# Patient Record
Sex: Male | Born: 1980
Health system: Midwestern US, Community
[De-identification: ages and names within clinical notes are randomized; demographics above are authoritative.]

---

## 2015-07-03 ENCOUNTER — Emergency Department (HOSPITAL_COMMUNITY): Payer: No Typology Code available for payment source

## 2015-07-03 ENCOUNTER — Encounter (HOSPITAL_COMMUNITY): Payer: Self-pay | Admitting: Emergency Medicine

## 2015-07-03 ENCOUNTER — Emergency Department (HOSPITAL_COMMUNITY)
Admission: EM | Admit: 2015-07-03 | Discharge: 2015-07-03 | Disposition: A | Discharge status: Home / Self Care | Payer: No Typology Code available for payment source | Attending: Emergency Medicine | Admitting: Emergency Medicine

## 2015-07-03 DIAGNOSIS — S80811A Abrasion, right lower leg, initial encounter: Secondary | ICD-10-CM | POA: Diagnosis not present

## 2015-07-03 DIAGNOSIS — S8991XA Unspecified injury of right lower leg, initial encounter: Secondary | ICD-10-CM | POA: Diagnosis present

## 2015-07-03 DIAGNOSIS — Y92410 Unspecified street and highway as the place of occurrence of the external cause: Secondary | ICD-10-CM | POA: Diagnosis not present

## 2015-07-03 DIAGNOSIS — Y9389 Activity, other specified: Secondary | ICD-10-CM | POA: Diagnosis not present

## 2015-07-03 DIAGNOSIS — Y998 Other external cause status: Secondary | ICD-10-CM | POA: Insufficient documentation

## 2015-07-03 DIAGNOSIS — F17200 Nicotine dependence, unspecified, uncomplicated: Secondary | ICD-10-CM | POA: Insufficient documentation

## 2015-07-03 DIAGNOSIS — M25562 Pain in left knee: Secondary | ICD-10-CM

## 2015-07-03 DIAGNOSIS — S8992XA Unspecified injury of left lower leg, initial encounter: Secondary | ICD-10-CM | POA: Diagnosis not present

## 2015-07-03 MED ORDER — NAPROXEN 500 MG PO TABS: 500.0000 mg | ORAL_TABLET | Freq: Two times a day (BID) | ORAL | Status: AC

## 2015-07-03 MED ORDER — METHOCARBAMOL 500 MG PO TABS
1000.0000 mg | ORAL_TABLET | Freq: Once | ORAL | Status: AC
Start: 2015-07-03 — End: 2015-07-03
  Administered 2015-07-03: 1000 mg via ORAL
  Filled 2015-07-03: qty 2

## 2015-07-03 MED ORDER — KETOROLAC TROMETHAMINE 60 MG/2ML IM SOLN
60.0000 mg | Freq: Once | INTRAMUSCULAR | Status: AC
  Administered 2015-07-03: 60 mg via INTRAMUSCULAR
  Filled 2015-07-03: qty 2

## 2015-07-03 MED ORDER — OXYCODONE-ACETAMINOPHEN 5-325 MG PO TABS
1.0000 | ORAL_TABLET | Freq: Once | ORAL | Status: AC
  Administered 2015-07-03: 1 via ORAL
  Filled 2015-07-03: qty 1

## 2015-07-03 MED ORDER — OXYCODONE-ACETAMINOPHEN 5-325 MG PO TABS: 1.0000 | ORAL_TABLET | ORAL | Status: AC | PRN

## 2015-07-03 NOTE — ED Notes (Signed)
Bed: WA29 Expected date:  Expected time:  Means of arrival:  Comments: 
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
## 2016-06-14 ENCOUNTER — Emergency Department: Admit: 2016-06-14

## 2016-06-14 ENCOUNTER — Inpatient Hospital Stay: Admit: 2016-06-14 | Discharge: 2016-06-14 | Disposition: A | Discharge status: Home / Self Care

## 2016-06-14 DIAGNOSIS — R1013 Epigastric pain: Secondary | ICD-10-CM

## 2016-06-14 LAB — CBC WITH AUTO DIFFERENTIAL
Basophils %: 0.4 %
Basophils Absolute: 0 10*3/uL (ref 0.0–0.2)
Eosinophils %: 1.5 %
Eosinophils Absolute: 0.1 10*3/uL (ref 0.0–0.7)
Hematocrit: 46.9 % (ref 42.0–52.0)
Hemoglobin: 16 g/dL (ref 14.0–18.0)
Lymphocytes %: 34 %
Lymphocytes Absolute: 2.9 10*3/uL (ref 1.0–4.8)
MCH: 30.9 pg (ref 27.0–31.3)
MCHC: 34.1 % (ref 33.0–37.0)
MCV: 90.5 fL (ref 80.0–100.0)
Monocytes %: 6.1 %
Monocytes Absolute: 0.5 10*3/uL (ref 0.2–0.8)
Neutrophils %: 58 %
Neutrophils Absolute: 5 10*3/uL (ref 1.4–6.5)
Platelets: 228 10*3/uL (ref 130–400)
RBC: 5.18 M/uL (ref 4.70–6.10)
RDW: 13.4 % (ref 11.5–14.5)
WBC: 8.7 10*3/uL (ref 4.8–10.8)

## 2016-06-14 LAB — URINALYSIS WITH REFLEX TO CULTURE
Bilirubin Urine: NEGATIVE
Blood, Urine: NEGATIVE
Glucose, Ur: NEGATIVE mg/dL
Ketones, Urine: NEGATIVE mg/dL
Leukocyte Esterase, Urine: NEGATIVE
Nitrite, Urine: NEGATIVE
Protein, UA: NEGATIVE mg/dL
Specific Gravity, UA: 1.031 (ref 1.005–1.030)
Urobilinogen, Urine: 0.2 E.U./dL (ref <2.0)
pH, UA: 6 (ref 5.0–9.0)

## 2016-06-14 LAB — URINE DRUG SCREEN
Amphetamine Screen, Urine: NEGATIVE (ref <1000)
Barbiturate Screen, Ur: NEGATIVE (ref <200)
Benzodiazepine Screen, Urine: NEGATIVE (ref <200)
Cannabinoid Scrn, Ur: NEGATIVE (ref <50)
Cocaine Metabolite Screen, Urine: NEGATIVE (ref <300)
Opiate Scrn, Ur: NEGATIVE (ref <300)
PCP Screen, Urine: NEGATIVE (ref <25)

## 2016-06-14 LAB — COMPREHENSIVE METABOLIC PANEL
ALT: 29 U/L (ref 0–41)
AST: 23 U/L (ref 0–40)
Albumin: 4.7 g/dL (ref 3.9–4.9)
Alkaline Phosphatase: 65 U/L (ref 35–104)
Anion Gap: 13 mEq/L (ref 7–13)
BUN: 15 mg/dL (ref 6–20)
CO2: 24 mEq/L (ref 22–29)
Calcium: 9.1 mg/dL (ref 8.6–10.2)
Chloride: 101 mEq/L (ref 98–107)
Creatinine: 0.81 mg/dL (ref 0.70–1.20)
GFR African American: >60 (ref >60)
GFR Non-African American: >60 (ref >60)
Globulin: 3.2 g/dL (ref 2.3–3.5)
Glucose: 92 mg/dL (ref 74–109)
Potassium: 4.2 mEq/L (ref 3.5–5.1)
Sodium: 138 mEq/L (ref 132–144)
Total Bilirubin: 0.4 mg/dL (ref 0.0–1.2)
Total Protein: 7.9 g/dL (ref 6.4–8.1)

## 2016-06-14 LAB — AMYLASE: Amylase: 56 U/L (ref 28–100)

## 2016-06-14 LAB — LIPASE: Lipase: 23 U/L (ref 13–60)

## 2016-06-14 MED ORDER — HYDROCODONE-ACETAMINOPHEN 5-325 MG PO TABS
5-325 MG | ORAL_TABLET | Freq: Three times a day (TID) | ORAL | 0 refills | Status: AC | PRN
Start: 2016-06-14 — End: 2016-06-17

## 2016-06-14 MED ORDER — OMEPRAZOLE 20 MG PO CPDR
20 MG | ORAL_CAPSULE | Freq: Every day | ORAL | 0 refills | Status: AC
Start: 2016-06-14 — End: ?

## 2016-06-14 MED ORDER — IOPAMIDOL 76 % IV SOLN
76 % | Freq: Once | INTRAVENOUS | Status: AC | PRN
Start: 2016-06-14 — End: 2016-06-14
  Administered 2016-06-14: 22:00:00 100 mL via INTRAVENOUS

## 2016-06-14 MED ORDER — SODIUM CHLORIDE 0.9 % IV BOLUS
0.9 | Freq: Once | INTRAVENOUS | Status: AC
Start: 2016-06-14 — End: 2016-06-14
  Administered 2016-06-14: 21:00:00 1000 mL via INTRAVENOUS

## 2016-06-14 MED ORDER — NORMAL SALINE FLUSH 0.9 % IV SOLN
0.9 % | Freq: Once | INTRAVENOUS | Status: AC
Start: 2016-06-14 — End: 2016-06-14
  Administered 2016-06-14: 22:00:00 10 mL via INTRAVENOUS

## 2016-06-14 MED ORDER — ONDANSETRON HCL 4 MG/2ML IJ SOLN
4 MG/2ML | Freq: Once | INTRAMUSCULAR | Status: AC
Start: 2016-06-14 — End: 2016-06-14
  Administered 2016-06-14: 21:00:00 4 mg via INTRAVENOUS

## 2016-06-14 MED ORDER — GI COCKTAIL
Freq: Once | Status: AC
Start: 2016-06-14 — End: 2016-06-14
  Administered 2016-06-14: 22:00:00 30 mL via ORAL

## 2016-06-14 MED ORDER — SUCRALFATE 1 GM/10ML PO SUSP
1 GM/0ML | Freq: Four times a day (QID) | ORAL | 0 refills | Status: AC
Start: 2016-06-14 — End: ?

## 2016-06-14 MED ORDER — MORPHINE SULFATE 4 MG/ML IJ SOLN
4 MG/ML | Freq: Once | INTRAMUSCULAR | Status: AC
Start: 2016-06-14 — End: 2016-06-14
  Administered 2016-06-14: 21:00:00 4 mg via INTRAVENOUS

## 2016-06-14 MED FILL — GI COCKTAIL: Qty: 30

## 2016-06-14 MED FILL — MONOJECT FLUSH SYRINGE 0.9 % IV SOLN: 0.9 % | INTRAVENOUS | Qty: 10

## 2016-06-14 MED FILL — ONDANSETRON HCL 4 MG/2ML IJ SOLN: 4 MG/2ML | INTRAMUSCULAR | Qty: 2

## 2016-06-14 MED FILL — MORPHINE SULFATE 4 MG/ML IJ SOLN: 4 mg/mL | INTRAMUSCULAR | Qty: 1

## 2016-06-14 MED FILL — SODIUM CHLORIDE 0.9 % IV SOLN: 0.9 % | INTRAVENOUS | Qty: 1000

## 2016-06-14 NOTE — ED Provider Notes (Signed)
[facility] ED  eMERGENCY dEPARTMENT eNCOUnter      Pt Name: Gabriel Wiley  MRN: [id number]  Birthdate [birth year]  Date of evaluation: 06/14/2016  Provider: Lorelee New, PA-C      HISTORY OF PRESENT ILLNESS    Gabriel Wiley is a 36 y.o. male who presents to the Emergency Department with Epigastric stomach pain.  This radiates to his back.  This is been going on for 3 months.  It's constant.  Sharp and stabbing.  The patient states he does have a history of pancreatitis.  He denies alcohol or gallbladder issues.  There is a family history of pancreatitis.  Patient is also been reporting nausea vomiting and diarrhea.  He had 4 episodes of emesis today and one episode of diarrhea.  He denies hematemesis or blood in stool.  He has tried Zantac, Tylenol and Motrin.  These are not helping.  The patient reports fever of 100.2??F.  He also reports dysuria.  He denies hematuria.  He denies chest pain tenderness but or difficulty breathing.        REVIEW OF SYSTEMS       Review of Systems   Constitutional: Positive for fever. Negative for chills, diaphoresis and fatigue.   HENT: Negative for congestion.    Respiratory: Negative for cough and shortness of breath.    Cardiovascular: Negative for chest pain and palpitations.   Gastrointestinal: Positive for abdominal pain, diarrhea, nausea and vomiting.   Genitourinary: Positive for dysuria. Negative for flank pain.   Skin: Negative for rash.   Neurological: Negative for dizziness, light-headedness and headaches.         PAST MEDICAL HISTORY     Past Medical History:   Diagnosis Date   ??? Pancreatitis          SURGICAL HISTORY       Past Surgical History:   Procedure Laterality Date   ??? APPENDECTOMY           CURRENT MEDICATIONS       Previous Medications    No medications on file       ALLERGIES     Patient has no known allergies.    FAMILY HISTORY     No family history on file.       SOCIAL HISTORY       Social History     Social History   ??? Marital status: Single      Spouse name: N/A   ??? Number of children: N/A   ??? Years of education: N/A     Social History Main Topics   ??? Smoking status: Current Every Day Smoker     Packs/day: 0.50     Years: 10.00     Types: Cigarettes   ??? Smokeless tobacco: Never Used      Comment: 2 cigarretes a day   ??? Alcohol use No   ??? Drug use: No   ??? Sexual activity: Not on file     Other Topics Concern   ??? Not on file     Social History Narrative   ??? No narrative on file       SCREENINGS             PHYSICAL EXAM    (up to 7 for level 4, 8 or more for level 5)     ED Triage Vitals [06/14/16 1542]   BP Temp Temp Source Pulse Resp SpO2 Height Weight   (!) 142/81 98.8 ??F (37.1 ??C)  Oral 85 16 98 % 5\' 7"  (1.702 m) 210 lb (95.3 kg)       Physical Exam   Constitutional: He is oriented to person, place, and time. He appears well-developed and well-nourished. He is active. No distress.   HENT:   Head: Normocephalic and atraumatic.   Mouth/Throat: Mucous membranes are normal.   Neck: Normal range of motion. Neck supple.   Cardiovascular: Normal rate, regular rhythm, normal heart sounds, intact distal pulses and normal pulses.    Pulmonary/Chest: Effort normal and breath sounds normal.   Abdominal: Soft. Normal appearance and bowel sounds are normal. There is tenderness in the epigastric area. There is no rigidity, no rebound and no guarding.   Neurological: He is alert and oriented to person, place, and time. He has normal strength.   Skin: Skin is warm, dry and intact. No rash noted. He is not diaphoretic.   Nursing note and vitals reviewed.        All other labs were within normal range or not returned as of this dictation.    EMERGENCY DEPARTMENT COURSE and DIFFERENTIAL DIAGNOSIS/MDM:   Vitals:    Vitals:    06/14/16 1542 06/14/16 1721   BP: (!) 142/81 119/76   Pulse: 85 76   Resp: 16 18   Temp: 98.8 ??F (37.1 ??C)    TempSrc: Oral    SpO2: 98% 97%   Weight: 210 lb (95.3 kg)    Height: 5\' 7"  (1.702 m)        ED Course        Patient is nontoxic no acute  distress.  He is resting comfortably.  Laboratory data is grossly unremarkable.  CT scan shows no acute pathology.  Urinalysis is grossly normal.  Patient's pain improved after GI cocktail significantly.  We discharged home with prescription for analgesic, omeprazole and Carafate.  He is instructed [facility] 2 days.  He is advised return to the ED for new or worsening symptoms.  Patient verbalized understanding of this plan.  Patient stable and ready for discharge.      PROCEDURES:  Unless otherwise noted below, none     Procedures      FINAL IMPRESSION      1. Abdominal pain, epigastric          DISPOSITION/PLAN   DISPOSITION Decision To Discharge 06/14/2016 05:29:57 PM          Lorelee NewHope B Smith, PA-C (electronically signed)  Attending Emergency Physician       Lorelee NewHope B Smith, PA-C  06/14/16 1737

## 2016-06-14 NOTE — ED Notes (Signed)
POC creat 0.8 called to CT scan.     Meda KlinefelterCheryl L Delicia Berens, RN  06/14/16 1635

## 2016-06-14 NOTE — ED Triage Notes (Signed)
Pt c/o abdominal pain for the past three months.  Pain was getting progressively worse the past three days. Abdomen is tender to touch all over BS+. LBM 3/6. No edema noted. Respirations even and nonlabored. LS clear.

## 2016-06-15 LAB — POCT VENOUS
GFR African American: >60 (ref >60)
GFR Non-African American: >60 (ref >60)
POC Creatinine: 0.8 mg/dL — ABNORMAL LOW (ref 0.9–1.3)
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
## 2019-12-04 LAB — CBC WITH AUTO DIFFERENTIAL
Absolute Eos #: 0.04 10*3/uL (ref 0.00–0.44)
Absolute Immature Granulocyte: 0.03 10*3/uL (ref 0.00–0.30)
Absolute Lymph #: 2.6 10*3/uL (ref 1.10–3.70)
Absolute Mono #: 0.73 10*3/uL (ref 0.10–1.20)
Basophils Absolute: 0.05 10*3/uL (ref 0.00–0.20)
Basophils: 0 % (ref 0–2)
Eosinophils %: 0 % — ABNORMAL LOW (ref 1–4)
Hematocrit: 52 % — ABNORMAL HIGH (ref 40.7–50.3)
Hemoglobin: 16.4 g/dL (ref 13.0–17.0)
Immature Granulocytes: 0 %
Lymphocytes: 23 % — ABNORMAL LOW (ref 24–43)
MCH: 29.9 pg (ref 25.2–33.5)
MCHC: 31.5 g/dL (ref 28.4–34.8)
MCV: 94.7 fL (ref 82.6–102.9)
MPV: 11 fL (ref 8.1–13.5)
Monocytes: 6 % (ref 3–12)
NRBC Automated: 0 per 100 WBC
Platelets: 275 10*3/uL (ref 138–453)
RBC: 5.49 m/uL (ref 4.21–5.77)
RDW: 13.9 % (ref 11.8–14.4)
Seg Neutrophils: 71 % — ABNORMAL HIGH (ref 36–65)
Segs Absolute: 8.06 10*3/uL (ref 1.50–8.10)
WBC: 11.5 10*3/uL — ABNORMAL HIGH (ref 3.5–11.3)

## 2019-12-04 LAB — LIPID PANEL
Chol/HDL Ratio: 3.4 (ref <5)
Cholesterol: 156 mg/dL (ref <200)
HDL: 46 mg/dL (ref >40)
LDL Cholesterol: 74 mg/dL (ref 0–130)
Triglycerides: 178 mg/dL — ABNORMAL HIGH (ref <150)

## 2019-12-04 LAB — COMPREHENSIVE METABOLIC PANEL
ALT: 32 U/L (ref 5–41)
AST: 25 U/L (ref <40)
Albumin/Globulin Ratio: 1.4 (ref 1.0–2.5)
Albumin: 4.9 g/dL (ref 3.5–5.2)
Alkaline Phosphatase: 73 U/L (ref 40–129)
Anion Gap: 20 mmol/L — ABNORMAL HIGH (ref 9–17)
BUN: 9 mg/dL (ref 6–20)
CO2: 21 mmol/L (ref 20–31)
Calcium: 9.3 mg/dL (ref 8.6–10.4)
Chloride: 101 mmol/L (ref 98–107)
Creatinine: 0.76 mg/dL (ref 0.70–1.20)
GFR African American: >60 mL/min (ref >60)
GFR Non-African American: >60 mL/min (ref >60)
Glucose: 67 mg/dL — ABNORMAL LOW (ref 70–99)
Potassium: 4.1 mmol/L (ref 3.7–5.3)
Sodium: 142 mmol/L (ref 135–144)
Total Bilirubin: 0.57 mg/dL (ref 0.3–1.2)
Total Protein: 8.5 g/dL — ABNORMAL HIGH (ref 6.4–8.3)

## 2019-12-04 LAB — TSH WITH REFLEX: TSH: 1.25 mIU/L (ref 0.30–5.00)

## 2019-12-04 LAB — PSA SCREENING: PSA: 2.07 ug/L (ref <4.1)
